# Patient Record
Sex: Male | Born: 1975
Health system: Southern US, Community
[De-identification: ages and names within clinical notes are randomized; demographics above are authoritative.]

## PROBLEM LIST (undated history)

## (undated) DIAGNOSIS — J45909 Unspecified asthma, uncomplicated: Secondary | ICD-10-CM

## (undated) DIAGNOSIS — G4730 Sleep apnea, unspecified: Secondary | ICD-10-CM

## (undated) HISTORY — PX: VASECTOMY: SHX75

---

## 2012-09-16 ENCOUNTER — Other Ambulatory Visit: Payer: Self-pay | Admitting: Urology

## 2012-09-17 ENCOUNTER — Encounter (HOSPITAL_BASED_OUTPATIENT_CLINIC_OR_DEPARTMENT_OTHER): Payer: Self-pay | Admitting: *Deleted

## 2012-09-17 NOTE — H&P (Signed)
History of Present Illness   Mr. Brandon Yang presents today for followup of his scrotal mass. Again, approximately 10 years ago he underwent vasectomy. He came to see me a little over a year ago with a 6-9 month history of a mass in his scrotum. At that time we found what appeared to be a smooth-wall mobile cystic-feeling mass in the inferior aspect of his scrotum. It was not clearly attached to the epididymis and we thought this could be a midline media raphe cyst. It did transilluminate at that time. He was to come in for an ultrasound and followup but canceled those appointments. In the interim, this has increased somewhat in size. It does not really hurt, but occasionally will be annoying to him. He has also noticed at times, difficulty maintaining erections. No other complaints        Past Medical History Problems  1. History of  Allergies V15.09  Surgical History Problems  1. History of  Surgery Of Male Genitalia Vasectomy V25.2  Current Meds 1. No Reported Medications  Allergies Medication  1. No Known Drug Allergies  Family History Problems  1. Paternal history of  Family Health Status - Father's Age 27yrs 2. Maternal history of  Family Health Status - Mother's Age 64yrs 3. Paternal history of  Hypertension V17.49 Denied  4. Family history of  Family Health Status Number Of Children  Social History Problems  1. Being A Social Drinker 2. Caffeine Use 0-1 qd 3. Marital History - Currently Married 4. Never A Smoker 5. 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 History of  Tobacco Use  Review of Systems Genitourinary, constitutional, skin, eye, otolaryngeal, hematologic/lymphatic, cardiovascular, pulmonary, endocrine, musculoskeletal, gastrointestinal, neurological and psychiatric system(s) were reviewed and pertinent findings if present are noted.  Genitourinary: nocturia, erectile dysfunction, scrotal swelling and scrotal mass.    Vitals Vital Signs [Data Includes: Last 1  Day]  16Jan2014 01:42PM  BMI Calculated: 22.66 BSA Calculated: 1.84 Height: 5 ft 9 in Weight: 153 lb  Blood Pressure: 134 / 82 Temperature: 97.3 F Heart Rate: 79  Physical Exam Well-developed well-nourished male in no acute distress. Respiratory: Normal effort Cardiac: Regular rate and rhythm Abdomen: Soft nontender no palpable masses Genitourinary: Examination of the penis demonstrates no discharge, no masses, no lesions and a normal meatus. The scrotum is without lesions. The right epididymis is palpably normal and non-tender. The left epididymis is palpably normal and non-tender. The right testis is palpably normal, non-tender and without masses. The left testis is normal, non-tender and without masses.    Results/Data Urine [Data Includes: Last 1 Day]   16Jan2014 COLOR ORANGE  APPEARANCE CLEAR  SPECIFIC GRAVITY >1.030  pH 6.0  GLUCOSE NEG mg/dL BILIRUBIN NEG  KETONE TRACE mg/dL BLOOD NEG  PROTEIN NEG mg/dL UROBILINOGEN 0.2 mg/dL NITRITE NEG  LEUKOCYTE ESTERASE NEG    Scrotal ultrasonography was performed today. I reviewed the images as well as came in for some real-time scanning. The full report is included on the worksheet. The testes themselves were completely symmetric and homogeneous in appearance. There was no evidence of any intratesticular pathology. There were some very tiny cysts in the epididymis on the head on the left side. Clearly distinct from the testicle was a inhomogeneous mass measuring approximately 3 cm x 4 cm. This appeared to be pretty much in the midline of the scrotum and did have some blood flow. This was clearly not cystic and appears to be solid.    Assessment Assessed  1. 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 SCROTAL  U/S  Done: 16Jan2014 12:00AM 3. Follow-up Schedule Surgery Office  Follow-up  Requested for: 16Jan2014  Discussion/Summary   Mr. Brandon Yang clearly has a solid abnormality in the midline of his scrotum. This is not clearly a simple cystic lesion. What is difficult to understand is that a little over a year ago he had performed transillumination and felt that clearly transilluminated. When I examined him, I also thought that this transilluminated prior to ordering his ultrasound. Again, he did not come in for those followup and then noticed that this did not appear to transilluminate and has increased in size. It is clearly solid and does need to be excised. My working diagnosis would be hopefully a benign soft tissue tumor such as a leiomyoma. Obviously, a low-grade leiomyosarcoma, etc could not be excluded at this point. Given the fact that this is clearly inferior to the testicle and not in any way attached to the testicle or to the spermatic cord/adnexal structures, I would approach this in a scrotal manner. I did ask Dr. Laverle Patter to examine this as well and he concurs with that assessment and treatment plan. I would anticipate outpatient surgery with fairly normal recovery within a few days. The patient has also had some mild erectile dysfunction and is interested in having some additional samples of Viagra. We are hopeful that once his surgery is finished, that his confidence will be better and his problem will vanish.   Signatures Electronically signed by : Barron Alvine, M.D.; Sep 16 2012  4:13PM

## 2012-09-17 NOTE — Progress Notes (Signed)
Pt instructed npo p mn 1/19 . To [facility] 1/20 @ 0715.  Needs hgb on arrival.

## 2012-09-20 ENCOUNTER — Encounter (HOSPITAL_BASED_OUTPATIENT_CLINIC_OR_DEPARTMENT_OTHER): Payer: Self-pay | Admitting: Anesthesiology

## 2012-09-20 ENCOUNTER — Ambulatory Visit (HOSPITAL_BASED_OUTPATIENT_CLINIC_OR_DEPARTMENT_OTHER)
Admission: RE | Admit: 2012-09-20 | Discharge: 2012-09-20 | Disposition: A | Discharge status: Home / Self Care | Payer: BC Managed Care – PPO | Source: Ambulatory Visit | Attending: Urology | Admitting: Urology

## 2012-09-20 ENCOUNTER — Ambulatory Visit (HOSPITAL_BASED_OUTPATIENT_CLINIC_OR_DEPARTMENT_OTHER): Payer: BC Managed Care – PPO | Admitting: Anesthesiology

## 2012-09-20 ENCOUNTER — Encounter (HOSPITAL_BASED_OUTPATIENT_CLINIC_OR_DEPARTMENT_OTHER): Payer: Self-pay | Admitting: *Deleted

## 2012-09-20 ENCOUNTER — Encounter (HOSPITAL_BASED_OUTPATIENT_CLINIC_OR_DEPARTMENT_OTHER)
Admission: RE | Disposition: A | Discharge status: Home / Self Care | Payer: Self-pay | Source: Ambulatory Visit | Attending: Urology

## 2012-09-20 DIAGNOSIS — N508 Other specified disorders of male genital organs: Secondary | ICD-10-CM | POA: Insufficient documentation

## 2012-09-20 DIAGNOSIS — N4340 Spermatocele of epididymis, unspecified: Secondary | ICD-10-CM | POA: Insufficient documentation

## 2012-09-20 DIAGNOSIS — D4959 Neoplasm of unspecified behavior of other genitourinary organ: Secondary | ICD-10-CM | POA: Insufficient documentation

## 2012-09-20 DIAGNOSIS — N529 Male erectile dysfunction, unspecified: Secondary | ICD-10-CM | POA: Insufficient documentation

## 2012-09-20 HISTORY — DX: Sleep apnea, unspecified: G47.30

## 2012-09-20 HISTORY — PX: SCROTAL EXPLORATION: SHX2386

## 2012-09-20 HISTORY — DX: Unspecified asthma, uncomplicated: J45.909

## 2012-09-20 LAB — POCT HEMOGLOBIN-HEMACUE: Hemoglobin: 15.3 g/dL (ref 13.0–17.0)

## 2012-09-20 SURGERY — EXPLORATION, SCROTUM
Anesthesia: General | Site: Groin | Wound class: Clean Contaminated

## 2012-09-20 MED ORDER — BUPIVACAINE HCL (PF) 0.25 % IJ SOLN
INTRAMUSCULAR | Status: DC | PRN
  Administered 2012-09-20: 7 mL

## 2012-09-20 MED ORDER — PROMETHAZINE HCL 25 MG/ML IJ SOLN
6.2500 mg | INTRAMUSCULAR | Status: DC | PRN
  Filled 2012-09-20: qty 1

## 2012-09-20 MED ORDER — FENTANYL CITRATE 0.05 MG/ML IJ SOLN
25.0000 ug | INTRAMUSCULAR | Status: DC | PRN
  Administered 2012-09-20: 25 ug via INTRAVENOUS
  Filled 2012-09-20: qty 1

## 2012-09-20 MED ORDER — CEFAZOLIN SODIUM-DEXTROSE 2-3 GM-% IV SOLR
2.0000 g | INTRAVENOUS | Status: DC
  Filled 2012-09-20: qty 50

## 2012-09-20 MED ORDER — LACTATED RINGERS IV SOLN
INTRAVENOUS | Status: DC
  Administered 2012-09-20 (×3): via INTRAVENOUS
  Filled 2012-09-20: qty 1000

## 2012-09-20 MED ORDER — EPHEDRINE SULFATE 50 MG/ML IJ SOLN
INTRAMUSCULAR | Status: DC | PRN
  Administered 2012-09-20: 15 mg via INTRAVENOUS

## 2012-09-20 MED ORDER — HYDROCODONE-ACETAMINOPHEN 5-325 MG PO TABS
1.0000 | ORAL_TABLET | Freq: Four times a day (QID) | ORAL | Status: DC | PRN
  Administered 2012-09-20: 1 via ORAL
  Filled 2012-09-20: qty 1

## 2012-09-20 MED ORDER — PROPOFOL 10 MG/ML IV BOLUS
INTRAVENOUS | Status: DC | PRN
  Administered 2012-09-20: 300 mg via INTRAVENOUS

## 2012-09-20 MED ORDER — CEFAZOLIN SODIUM-DEXTROSE 2-3 GM-% IV SOLR
INTRAVENOUS | Status: DC | PRN
  Administered 2012-09-20: 2 g via INTRAVENOUS

## 2012-09-20 MED ORDER — MIDAZOLAM HCL 5 MG/5ML IJ SOLN
INTRAMUSCULAR | Status: DC | PRN
  Administered 2012-09-20: 2 mg via INTRAVENOUS

## 2012-09-20 MED ORDER — ONDANSETRON HCL 4 MG/2ML IJ SOLN
INTRAMUSCULAR | Status: DC | PRN
  Administered 2012-09-20: 4 mg via INTRAVENOUS

## 2012-09-20 MED ORDER — DEXAMETHASONE SODIUM PHOSPHATE 4 MG/ML IJ SOLN
INTRAMUSCULAR | Status: DC | PRN
  Administered 2012-09-20: 10 mg via INTRAVENOUS

## 2012-09-20 MED ORDER — KETOROLAC TROMETHAMINE 30 MG/ML IJ SOLN
15.0000 mg | Freq: Once | INTRAMUSCULAR | Status: DC | PRN
  Filled 2012-09-20: qty 1

## 2012-09-20 MED ORDER — HYDROCODONE-ACETAMINOPHEN 5-325 MG PO TABS: 1.0000 | ORAL_TABLET | Freq: Four times a day (QID) | ORAL | Status: DC | PRN

## 2012-09-20 MED ORDER — LIDOCAINE HCL (CARDIAC) 20 MG/ML IV SOLN
INTRAVENOUS | Status: DC | PRN
  Administered 2012-09-20: 80 mg via INTRAVENOUS

## 2012-09-20 MED ORDER — FENTANYL CITRATE 0.05 MG/ML IJ SOLN
INTRAMUSCULAR | Status: DC | PRN
  Administered 2012-09-20 (×2): 25 ug via INTRAVENOUS
  Administered 2012-09-20: 50 ug via INTRAVENOUS

## 2012-09-20 SURGICAL SUPPLY — 52 items
APPLICATOR COTTON TIP 6IN STRL (MISCELLANEOUS) ×2 IMPLANT
BANDAGE GAUZE ELAST BULKY 4 IN (GAUZE/BANDAGES/DRESSINGS) ×2 IMPLANT
BLADE SURG 10 STRL SS (BLADE) IMPLANT
BLADE SURG 15 STRL LF DISP TIS (BLADE) ×1 IMPLANT
BLADE SURG 15 STRL SS (BLADE) ×1
BLADE SURG ROTATE 9660 (MISCELLANEOUS) ×2 IMPLANT
CANISTER SUCTION 1200CC (MISCELLANEOUS) IMPLANT
CANISTER SUCTION 2500CC (MISCELLANEOUS) IMPLANT
CLEANER CAUTERY TIP 5X5 PAD (MISCELLANEOUS) ×1 IMPLANT
CLOTH BEACON ORANGE TIMEOUT ST (SAFETY) ×2 IMPLANT
COVER MAYO STAND STRL (DRAPES) ×2 IMPLANT
COVER TABLE BACK 60X90 (DRAPES) ×2 IMPLANT
DISSECTOR ROUND CHERRY 3/8 STR (MISCELLANEOUS) IMPLANT
DRAIN PENROSE 18X1/4 LTX STRL (WOUND CARE) IMPLANT
DRAPE PED LAPAROTOMY (DRAPES) ×2 IMPLANT
DRSG TEGADERM 2-3/8X2-3/4 SM (GAUZE/BANDAGES/DRESSINGS) ×2 IMPLANT
ELECT NEEDLE TIP 2.8 STRL (NEEDLE) ×2 IMPLANT
ELECT REM PT RETURN 9FT ADLT (ELECTROSURGICAL) ×2
ELECTRODE REM PT RTRN 9FT ADLT (ELECTROSURGICAL) ×1 IMPLANT
ETHICON ×2 IMPLANT
GLOVE BIO SURGEON STRL SZ7.5 (GLOVE) ×2 IMPLANT
GLOVE BIO SURGEON STRL SZ8 (GLOVE) ×2 IMPLANT
GLOVE BIOGEL PI IND STRL 6.5 (GLOVE) ×1 IMPLANT
GLOVE BIOGEL PI IND STRL 7.0 (GLOVE) ×2 IMPLANT
GLOVE BIOGEL PI INDICATOR 6.5 (GLOVE) ×1
GLOVE BIOGEL PI INDICATOR 7.0 (GLOVE) ×2
GOWN PREVENTION PLUS XLARGE (GOWN DISPOSABLE) ×4 IMPLANT
GOWN STRL NON-REIN LRG LVL3 (GOWN DISPOSABLE) ×2 IMPLANT
GOWN W/COTTON TOWEL STD LRG (GOWNS) ×2 IMPLANT
GOWN XL W/COTTON TOWEL STD (GOWNS) ×2 IMPLANT
LOOP VESSEL MAXI BLUE (MISCELLANEOUS) IMPLANT
NEEDLE HYPO 22GX1.5 SAFETY (NEEDLE) ×2 IMPLANT
NS IRRIG 500ML POUR BTL (IV SOLUTION) IMPLANT
PACK BASIN DAY SURGERY FS (CUSTOM PROCEDURE TRAY) ×2 IMPLANT
PAD CLEANER CAUTERY TIP 5X5 (MISCELLANEOUS) ×1
PENCIL BUTTON HOLSTER BLD 10FT (ELECTRODE) ×2 IMPLANT
STRIP CLOSURE SKIN 1/2X4 (GAUZE/BANDAGES/DRESSINGS) IMPLANT
SUPPORT SCROTAL LG STRP (MISCELLANEOUS) ×2 IMPLANT
SUT SILK 0 SH 30 (SUTURE) IMPLANT
SUT SILK 0 TIES 10X30 (SUTURE) IMPLANT
SUT VIC AB 2-0 CT1 27 (SUTURE) ×1
SUT VIC AB 2-0 CT1 TAPERPNT 27 (SUTURE) ×1 IMPLANT
SUT VIC AB 3-0 CT1 36 (SUTURE) IMPLANT
SUT VIC AB 3-0 SH 27 (SUTURE) ×1
SUT VIC AB 3-0 SH 27X BRD (SUTURE) ×1 IMPLANT
SUT VICRYL 4-0 PS2 18IN ABS (SUTURE) ×2 IMPLANT
SYR BULB IRRIGATION 50ML (SYRINGE) IMPLANT
SYR CONTROL 10ML LL (SYRINGE) ×2 IMPLANT
TRAY DSU PREP LF (CUSTOM PROCEDURE TRAY) ×2 IMPLANT
TUBE CONNECTING 12X1/4 (SUCTIONS) ×2 IMPLANT
WATER STERILE IRR 500ML POUR (IV SOLUTION) IMPLANT
YANKAUER SUCT BULB TIP NO VENT (SUCTIONS) ×2 IMPLANT

## 2012-09-20 NOTE — Anesthesia Procedure Notes (Signed)
Procedure Name: LMA Insertion Date/Time: 09/20/2012 8:31 AM Performed by: Fran Lowes Pre-anesthesia Checklist: Patient identified, Emergency Drugs available, Suction available and Patient being monitored Patient Re-evaluated:Patient Re-evaluated prior to inductionOxygen Delivery Method: Circle System Utilized Preoxygenation: Pre-oxygenation with 100% oxygen Intubation Type: IV induction Ventilation: Mask ventilation without difficulty LMA: LMA inserted LMA Size: 4.0 Number of attempts: 1 Airway Equipment and Method: bite block Placement Confirmation: positive ETCO2 Tube secured with: Tape Dental Injury: Teeth and Oropharynx as per pre-operative assessment

## 2012-09-20 NOTE — Anesthesia Postprocedure Evaluation (Signed)
  Anesthesia Post-op Note  Patient: Brandon Yang  Procedure(s) Performed: Procedure(s) (LRB): SCROTUM EXPLORATION (N/A)  Patient Location: PACU  Anesthesia Type: General  Level of Consciousness: awake and alert   Airway and Oxygen Therapy: Patient Spontanous Breathing  Post-op Pain: mild  Post-op Assessment: Post-op Vital signs reviewed, Patient's Cardiovascular Status Stable, Respiratory Function Stable, Patent Airway and No signs of Nausea or vomiting  Last Vitals:  Filed Vitals:   09/20/12 0935  BP: 136/75  Pulse: 72  Temp: 36.3 C  Resp: 13    Post-op Vital Signs: stable   Complications: No apparent anesthesia complications

## 2012-09-20 NOTE — Anesthesia Preprocedure Evaluation (Signed)
Anesthesia Evaluation  Patient identified by MRN, date of birth, ID band Patient awake    Reviewed: Allergy & Precautions, H&P , NPO status , Patient's Chart, lab work & pertinent test results  Airway Mallampati: II TM Distance: >3 FB Neck ROM: Full    Dental No notable dental hx.    Pulmonary asthma , sleep apnea ,  Mild asthma breath sounds clear to auscultation  Pulmonary exam normal       Cardiovascular negative cardio ROS  Rhythm:Regular Rate:Normal     Neuro/Psych negative neurological ROS  negative psych ROS   GI/Hepatic negative GI ROS, Neg liver ROS,   Endo/Other  negative endocrine ROS  Renal/GU negative Renal ROS  negative genitourinary   Musculoskeletal negative musculoskeletal ROS (+)   Abdominal   Peds negative pediatric ROS (+)  Hematology negative hematology ROS (+)   Anesthesia Other Findings   Reproductive/Obstetrics negative OB ROS                           Anesthesia Physical Anesthesia Plan  ASA: II  Anesthesia Plan: General   Post-op Pain Management:    Induction: Intravenous  Airway Management Planned: LMA  Additional Equipment:   Intra-op Plan:   Post-operative Plan:   Informed Consent: I have reviewed the patients History and Physical, chart, labs and discussed the procedure including the risks, benefits and alternatives for the proposed anesthesia with the patient or authorized representative who has indicated his/her understanding and acceptance.   Dental advisory given  Plan Discussed with: CRNA and Surgeon  Anesthesia Plan Comments:         Anesthesia Quick Evaluation

## 2012-09-20 NOTE — Transfer of Care (Signed)
Immediate Anesthesia Transfer of Care Note  Patient: Brandon Yang  Procedure(s) Performed: Procedure(s) (LRB): SCROTUM EXPLORATION (N/A)  Patient Location: Patient transported to PACU with oxygen via face mask at 4 Liters / Min  Anesthesia Type: General  Level of Consciousness: awake and alert   Airway & Oxygen Therapy: Patient Spontanous Breathing and Patient connected to face mask oxygen  Post-op Assessment: Report given to PACU RN and Post -op Vital signs reviewed and stable  Post vital signs: Reviewed and stable  Dentition: Teeth and oropharynx remain in pre-op condition  Complications: No apparent anesthesia complications

## 2012-09-20 NOTE — Op Note (Signed)
Preoperative diagnosis:peritesticular/scrotal mass Postoperative diagnosis:same  Procedure:scrotal exploration with excision of right peritesticular mass with partial epididymectomy   Surgeon: Valetta Fuller M.D.  Anesthesia: Gen.  Indications:patient is 37 years of age. He presented to see me a little over a year ago with a scrotal mass. At that time he was felt to have what palpably was a cystic mass in the midline of the scrotum. We felt that this was likely to be an inclusion cyst and felt that there was some transillumination. We had suggested a scrotal ultrasound to confirm that along with a followup. he canceled several appointments and never came back for followup until recently.on repeat exam this mass appeared to be larger and did not transilluminate. It did feel quite smooth and was very mobile. Ultrasound confirmed a scrotal mass completely discrete from the testicle. It did show blood flow and was clearly mixed echogenicity. This was not a simple cyst. We recommend surgical exploration with excision. We felt the chance of malignancy was low but certainly not zero. Clinically it was difficult to determine whether this was associated with the epididymis but clearly again was not associated with the testicle.     Technique and findings:patient was brought to the operating room where he underwent induction of general anesthesia. He was in a standard supine position and prepped and draped in usual manner. He has compression boots were utilized. The small incision was made in the median nerve of the scrotum. This mass measured approximately 4-5 cm. Again the mass appeared to be quite mobile and was easily dissected free of the overlying scrotal wall. It was clear however that was attached to the inferior aspect of the epididymis. There was a significant degree of blood supply into this mass. There were no obvious attachments to the scrotal wall. The mass was completely freed other than its attachment  to the inferior epididymis. Care was taken to leave some surrounding adventitia overlying the mass. The testicle itself on that side was slightly atrophic but otherwise palpably normal and the head of the epididymis revealed no pathology. We carefully dissected this down to its attachment to the epididymis where it was transected with hemostats and Vicryl suture was utilized. The mass along with a small portion of the epididymis was submitted for permanent pathologic analysis. The spermatic cord was injected with Marcaine. The hemiscrotum was irrigated. The testis was returned to the right hemiscrotum making sure that there was no obvious twisting of the spermatic cord. Multiple layers of Vicryl was used for closure. There were no obvious complications or problems. Patient was brought to recovery room in stable condition.

## 2012-09-20 NOTE — Interval H&P Note (Signed)
History and Physical Interval Note:  09/20/2012 8:24 AM  Brandon Yang  has presented today for surgery, with the diagnosis of SCROTAL MASS  The various methods of treatment have been discussed with the patient and family. After consideration of risks, benefits and other options for treatment, the patient has consented to  Procedure(s) (LRB) with comments: SCROTUM EXPLORATION (N/A) - 1 HR ALSO EXCISION OF SCROTAL MASS  as a surgical intervention .  The patient's history has been reviewed, patient examined, no change in status, stable for surgery.  I have reviewed the patient's chart and labs.  Questions were answered to the patient's satisfaction.     Brandon Yang S

## 2012-09-21 ENCOUNTER — Encounter (HOSPITAL_BASED_OUTPATIENT_CLINIC_OR_DEPARTMENT_OTHER): Payer: Self-pay | Admitting: Urology

## 2015-05-23 ENCOUNTER — Encounter (HOSPITAL_BASED_OUTPATIENT_CLINIC_OR_DEPARTMENT_OTHER): Payer: Self-pay

## 2015-05-23 ENCOUNTER — Emergency Department (HOSPITAL_BASED_OUTPATIENT_CLINIC_OR_DEPARTMENT_OTHER)
Admission: EM | Admit: 2015-05-23 | Discharge: 2015-05-23 | Disposition: A | Discharge status: Home / Self Care | Payer: 59 | Attending: Emergency Medicine | Admitting: Emergency Medicine

## 2015-05-23 ENCOUNTER — Ambulatory Visit: Payer: Self-pay | Admitting: Physician Assistant

## 2015-05-23 ENCOUNTER — Telehealth: Payer: Self-pay | Admitting: General Practice

## 2015-05-23 ENCOUNTER — Telehealth: Payer: Self-pay | Admitting: Physician Assistant

## 2015-05-23 ENCOUNTER — Emergency Department (HOSPITAL_BASED_OUTPATIENT_CLINIC_OR_DEPARTMENT_OTHER): Payer: 59

## 2015-05-23 DIAGNOSIS — R319 Hematuria, unspecified: Secondary | ICD-10-CM | POA: Insufficient documentation

## 2015-05-23 DIAGNOSIS — Z8669 Personal history of other diseases of the nervous system and sense organs: Secondary | ICD-10-CM | POA: Diagnosis not present

## 2015-05-23 DIAGNOSIS — Z79899 Other long term (current) drug therapy: Secondary | ICD-10-CM | POA: Diagnosis not present

## 2015-05-23 DIAGNOSIS — J45909 Unspecified asthma, uncomplicated: Secondary | ICD-10-CM | POA: Insufficient documentation

## 2015-05-23 DIAGNOSIS — Z720 Tobacco use: Secondary | ICD-10-CM | POA: Insufficient documentation

## 2015-05-23 LAB — URINALYSIS, ROUTINE W REFLEX MICROSCOPIC
Bilirubin Urine: NEGATIVE
GLUCOSE, UA: NEGATIVE mg/dL
KETONES UR: NEGATIVE mg/dL
Leukocytes, UA: NEGATIVE
NITRITE: NEGATIVE
PH: 6 (ref 5.0–8.0)
PROTEIN: NEGATIVE mg/dL
SPECIFIC GRAVITY, URINE: 1.021 (ref 1.005–1.030)
UROBILINOGEN UA: 0.2 mg/dL (ref 0.0–1.0)

## 2015-05-23 LAB — URINE MICROSCOPIC-ADD ON

## 2015-05-23 NOTE — ED Notes (Signed)
Pt verbalizes understanding of d/c instructions and denies any further needs at this time. 

## 2015-05-23 NOTE — Telephone Encounter (Signed)
Patient Brandon Yang canceling his appointment due to him being seen the ED last night.

## 2015-05-23 NOTE — ED Notes (Signed)
Pt states he had an episode of hematuria that started about 30 minutes ago.  He denies any pain, had intercourse tonight but denies any trauma, had some mild lower back pain earlier today.

## 2015-05-23 NOTE — Discharge Instructions (Signed)

## 2015-05-23 NOTE — Telephone Encounter (Signed)
Noted  

## 2015-05-23 NOTE — ED Provider Notes (Signed)
CSN: [id number]     Arrival date & time 05/23/15  0160 History   First MD Initiated Contact with Patient 05/23/15 [phone]     Chief Complaint  Patient presents with  . Hematuria     (Consider location/radiation/quality/duration/timing/severity/associated sxs/prior Treatment) Patient is a 39 y.o. male presenting with hematuria. The history is provided by the patient.  Hematuria This is a new problem. The current episode started 1 to 2 hours ago. The problem occurs constantly. The problem has not changed since onset.Pertinent negatives include no chest pain, no abdominal pain, no headaches and no shortness of breath. Nothing aggravates the symptoms. Nothing relieves the symptoms. He has tried nothing for the symptoms. The treatment provided no relief.  Had sex 4 hours before symptoms started awoke to urinate now painless hematuria  Past Medical History  Diagnosis Date  . Asthma     related to seasonal/environmental allergies  . Sleep apnea     pt states he snores loudly and thinks he has "some obstruction" at times. no sleep study   Past Surgical History  Procedure Laterality Date  . Scrotal exploration  09/20/2012    Procedure: SCROTUM EXPLORATION;  Surgeon: Bernestine Amass, MD;  Location: [facility];  Service: Urology;  Laterality: N/A;  1 HR ALSO EXCISION OF SCROTAL MASS   . Vasectomy     History reviewed. No pertinent family history. Social History  Substance Use Topics  . Smoking status: Current Some Day Smoker    Types: Cigarettes  . Smokeless tobacco: None  . Alcohol Use: 1.2 oz/week    2 Cans of beer per week    Review of Systems  Constitutional: Negative for fever.  Respiratory: Negative for shortness of breath.   Cardiovascular: Negative for chest pain.  Gastrointestinal: Negative for nausea, vomiting and abdominal pain.  Genitourinary: Positive for hematuria. Negative for dysuria, frequency, flank pain, discharge, penile swelling, scrotal swelling,  difficulty urinating, genital sores, penile pain and testicular pain.  Neurological: Negative for headaches.  All other systems reviewed and are negative.     Allergies  Wheat bran  Home Medications   Prior to Admission medications   Medication Sig Start Date End Date Taking? Authorizing Provider  albuterol (PROVENTIL HFA;VENTOLIN HFA) 108 (90 BASE) MCG/ACT inhaler Inhale 2 puffs into the lungs every 6 (six) hours as needed. Hasn't used in over a year    Historical Provider, MD  beta carotene w/minerals (OCUVITE) tablet Take 1 tablet by mouth daily.    Historical Provider, MD  HYDROcodone-acetaminophen (NORCO/VICODIN) 5-325 MG per tablet Take 1-2 tablets by mouth every 6 (six) hours as needed for pain. 09/20/12   Rana Snare, MD  loratadine (CLARITIN) 10 MG tablet Take 10 mg by mouth as needed.    Historical Provider, MD   BP 162/117 mmHg  Pulse 72  Temp(Src) 98 F (36.7 C) (Oral)  Resp 20  Ht 5\' 8"  (1.727 m)  Wt 155 lb (70.308 kg)  BMI 23.57 kg/m2  SpO2 98% Physical Exam  Constitutional: He is oriented to person, place, and time. He appears well-developed and well-nourished. No distress.  HENT:  Head: Normocephalic and atraumatic.  Mouth/Throat: Oropharynx is clear and moist.  Eyes: Conjunctivae are normal. Pupils are equal, round, and reactive to light.  Neck: Normal range of motion. Neck supple.  Cardiovascular: Normal rate, regular rhythm and intact distal pulses.   Pulmonary/Chest: Effort normal and breath sounds normal. No respiratory distress. He has no wheezes. He has no rales.  Abdominal:  Soft. Bowel sounds are normal. There is no tenderness. There is no rebound and no guarding.  Musculoskeletal: Normal range of motion.  Neurological: He is alert and oriented to person, place, and time.  Skin: Skin is warm.  Psychiatric: He has a normal mood and affect.    ED Course  Procedures (including critical care time) Labs Review Labs Reviewed  URINALYSIS, ROUTINE W  REFLEX MICROSCOPIC (NOT AT [facility]) - Abnormal; Notable for the following:    Hgb urine dipstick MODERATE (*)    All other components within normal limits  URINE MICROSCOPIC-ADD ON    Imaging Review Ct Renal Stone Study  05/23/2015   CLINICAL DATA:  Hematuria starting 30 minutes ago.  EXAM: CT ABDOMEN AND PELVIS WITHOUT CONTRAST  TECHNIQUE: Multidetector CT imaging of the abdomen and pelvis was performed following the standard protocol without IV contrast.  COMPARISON:  None.  FINDINGS: Lower chest and abdominal wall:  No contributory findings.  Hepatobiliary: No focal liver abnormality.No evidence of biliary obstruction or stone.  Pancreas: Unremarkable.  Spleen: Unremarkable.  Adrenals/Urinary Tract: Negative adrenals. No hydronephrosis or stone. No gross mass lesion by noncontrast technique. Unremarkable bladder.  Reproductive:No pathologic findings.  Stomach/Bowel:  No obstruction. No appendicitis.  Vascular/Lymphatic: No acute vascular abnormality. No mass or adenopathy.  Peritoneal: No ascites or pneumoperitoneum.  Musculoskeletal: No acute abnormalities.  IMPRESSION: Normal study.  No explanation for hematuria.   Electronically Signed   By: Monte Fantasia M.D.   On: 05/23/2015 05:30   I have personally reviewed and evaluated these images and lab results as part of my medical decision-making.   EKG Interpretation None      MDM   Final diagnoses:  Hematuria    Follow up with Dr. Risa Grill for ongoing work up of painless hematuria.  Patient verbalizes understanding and agrees to follow up    April Palumbo, MD 05/23/15 [phone]

## 2015-05-30 NOTE — Telephone Encounter (Signed)
Pt was no show 05/23/15 2:30pm for new patient appt, pt has not rescheduled, charge for no show?

## 2015-05-30 NOTE — Telephone Encounter (Signed)
No charge. 

## 2015-12-27 ENCOUNTER — Encounter (HOSPITAL_COMMUNITY): Payer: Self-pay | Admitting: Emergency Medicine

## 2015-12-27 ENCOUNTER — Emergency Department (HOSPITAL_COMMUNITY)
Admission: EM | Admit: 2015-12-27 | Discharge: 2015-12-27 | Disposition: A | Discharge status: Home / Self Care | Payer: Self-pay | Attending: Emergency Medicine | Admitting: Emergency Medicine

## 2015-12-27 DIAGNOSIS — J45909 Unspecified asthma, uncomplicated: Secondary | ICD-10-CM | POA: Insufficient documentation

## 2015-12-27 DIAGNOSIS — G4730 Sleep apnea, unspecified: Secondary | ICD-10-CM | POA: Insufficient documentation

## 2015-12-27 DIAGNOSIS — Z79891 Long term (current) use of opiate analgesic: Secondary | ICD-10-CM | POA: Insufficient documentation

## 2015-12-27 DIAGNOSIS — N390 Urinary tract infection, site not specified: Secondary | ICD-10-CM | POA: Insufficient documentation

## 2015-12-27 DIAGNOSIS — F17210 Nicotine dependence, cigarettes, uncomplicated: Secondary | ICD-10-CM | POA: Insufficient documentation

## 2015-12-27 DIAGNOSIS — Z79899 Other long term (current) drug therapy: Secondary | ICD-10-CM | POA: Insufficient documentation

## 2015-12-27 DIAGNOSIS — Z7951 Long term (current) use of inhaled steroids: Secondary | ICD-10-CM | POA: Insufficient documentation

## 2015-12-27 LAB — URINALYSIS, ROUTINE W REFLEX MICROSCOPIC
BILIRUBIN URINE: NEGATIVE
Glucose, UA: NEGATIVE mg/dL
HGB URINE DIPSTICK: NEGATIVE
Ketones, ur: NEGATIVE mg/dL
NITRITE: NEGATIVE
PROTEIN: NEGATIVE mg/dL
Specific Gravity, Urine: 1.012 (ref 1.005–1.030)
pH: 6.5 (ref 5.0–8.0)

## 2015-12-27 LAB — URINE MICROSCOPIC-ADD ON
Bacteria, UA: NONE SEEN
RBC / HPF: NONE SEEN RBC/hpf (ref 0–5)
SQUAMOUS EPITHELIAL / LPF: NONE SEEN

## 2015-12-27 MED ORDER — CEPHALEXIN 250 MG PO CAPS: 500.0000 mg | ORAL_CAPSULE | Freq: Two times a day (BID) | ORAL | Status: DC

## 2015-12-27 MED ORDER — CEFTRIAXONE SODIUM 250 MG IJ SOLR
250.0000 mg | Freq: Once | INTRAMUSCULAR | Status: AC
  Administered 2015-12-27: 250 mg via INTRAMUSCULAR
  Filled 2015-12-27: qty 250

## 2015-12-27 MED ORDER — AZITHROMYCIN 250 MG PO TABS
1000.0000 mg | ORAL_TABLET | Freq: Once | ORAL | Status: AC
  Administered 2015-12-27: 1000 mg via ORAL
  Filled 2015-12-27: qty 4

## 2015-12-27 MED ORDER — STERILE WATER FOR INJECTION IJ SOLN
INTRAMUSCULAR | Status: AC
  Filled 2015-12-27: qty 10

## 2015-12-27 NOTE — ED Notes (Signed)
Noticed "pink-ish" colored urine starting a couple hours ago. Pt states this happened last year and says they were concerned for a bacterial infection. Denies any other urinary symptoms. States he does feel "slight throbbing" in his right testicle.

## 2015-12-27 NOTE — Discharge Instructions (Signed)
Sexually Transmitted Disease °A sexually transmitted disease (STD) is a disease or infection that may be passed (transmitted) from person to person, usually during sexual activity. This may happen by way of saliva, semen, blood, vaginal mucus, or urine. Common STDs include: °· Gonorrhea. °· Chlamydia. °· Syphilis. °· HIV and AIDS. °· Genital herpes. °· Hepatitis B and C. °· Trichomonas. °· Human papillomavirus (HPV). °· Pubic lice. °· Scabies. °· Mites. °· Bacterial vaginosis. °WHAT ARE CAUSES OF STDs? °An STD may be caused by bacteria, a virus, or parasites. STDs are often transmitted during sexual activity if one person is infected. However, they may also be transmitted through nonsexual means. STDs may be transmitted after:  °· Sexual intercourse with an infected person. °· Sharing sex toys with an infected person. °· Sharing needles with an infected person or using unclean piercing or tattoo needles. °· Having intimate contact with the genitals, mouth, or rectal areas of an infected person. °· Exposure to infected fluids during birth. °WHAT ARE THE SIGNS AND SYMPTOMS OF STDs? °Different STDs have different symptoms. Some people may not have any symptoms. If symptoms are present, they may include: °· Painful or bloody urination. °· Pain in the pelvis, abdomen, vagina, anus, throat, or eyes. °· A skin rash, itching, or irritation. °· Growths, ulcerations, blisters, or sores in the genital and anal areas. °· Abnormal vaginal discharge with or without bad odor. °· Penile discharge in men. °· Fever. °· Pain or bleeding during sexual intercourse. °· Swollen glands in the groin area. °· Yellow skin and eyes (jaundice). This is seen with hepatitis. °· Swollen testicles. °· Infertility. °· Sores and blisters in the mouth. °HOW ARE STDs DIAGNOSED? °To make a diagnosis, your health care provider may: °· Take a medical history. °· Perform a physical exam. °· Take a sample of any discharge to examine. °· Swab the throat,  cervix, opening to the penis, rectum, or vagina for testing. °· Test a sample of your first morning urine. °· Perform blood tests. °· Perform a Pap test, if this applies. °· Perform a colposcopy. °· Perform a laparoscopy. °HOW ARE STDs TREATED? °Treatment depends on the STD. Some STDs may be treated but not cured. °· Chlamydia, gonorrhea, trichomonas, and syphilis can be cured with antibiotic medicine. °· Genital herpes, hepatitis, and HIV can be treated, but not cured, with prescribed medicines. The medicines lessen symptoms. °· Genital warts from HPV can be treated with medicine or by freezing, burning (electrocautery), or surgery. Warts may come back. °· HPV cannot be cured with medicine or surgery. However, abnormal areas may be removed from the cervix, vagina, or vulva. °· If your diagnosis is confirmed, your recent sexual partners need treatment. This is true even if they are symptom-free or have a negative culture or evaluation. They should not have sex until their health care providers say it is okay. °· Your health care provider may test you for infection again 3 months after treatment. °HOW CAN I REDUCE MY RISK OF GETTING AN STD? °Take these steps to reduce your risk of getting an STD: °· Use latex condoms, dental dams, and water-soluble lubricants during sexual activity. Do not use petroleum jelly or oils. °· Avoid having multiple sex partners. °· Do not have sex with someone who has other sex partners °· Do not have sex with anyone you do not know or who is at high risk for an STD. °· Avoid risky sex practices that can break your skin. °· Do not have sex   if you have open sores on your mouth or skin.  Avoid drinking too much alcohol or taking illegal drugs. Alcohol and drugs can affect your judgment and put you in a vulnerable position.  Avoid engaging in oral and anal sex acts.  Get vaccinated for HPV and hepatitis. If you have not received these vaccines in the past, talk to your health care  provider about whether one or both might be right for you.  If you are at risk of being infected with HIV, it is recommended that you take a prescription medicine daily to prevent HIV infection. This is called pre-exposure prophylaxis (PrEP). You are considered at risk if:  You are a man who has sex with other men (MSM).  You are a heterosexual man or woman and are sexually active with more than one partner.  You take drugs by injection.  You are sexually active with a partner who has HIV.  Talk with your health care provider about whether you are at high risk of being infected with HIV. If you choose to begin PrEP, you should first be tested for HIV. You should then be tested every 3 months for as long as you are taking PrEP. WHAT SHOULD I DO IF I THINK I HAVE AN STD?  See your health care provider.  Tell your sexual partner(s). They should be tested and treated for any STDs.  Do not have sex until your health care provider says it is okay. WHEN SHOULD I GET IMMEDIATE MEDICAL CARE? Contact your health care provider right away if:   You have severe abdominal pain.  You are a man and notice swelling or pain in your testicles.  You are a woman and notice swelling or pain in your vagina.   This information is not intended to replace advice given to you by your health care provider. Make sure you discuss any questions you have with your health care provider.   Document Released: 11/08/2002 Document Revised: 09/08/2014 Document Reviewed: 03/08/2013 Elsevier Interactive Patient Education 2016 Elsevier Inc. Urinary Tract Infection Urinary tract infections (UTIs) can develop anywhere along your urinary tract. Your urinary tract is your body's drainage system for removing wastes and extra water. Your urinary tract includes two kidneys, two ureters, a bladder, and a urethra. Your kidneys are a pair of bean-shaped organs. Each kidney is about the size of your fist. They are located below your  ribs, one on each side of your spine. CAUSES Infections are caused by microbes, which are microscopic organisms, including fungi, viruses, and bacteria. These organisms are so small that they can only be seen through a microscope. Bacteria are the microbes that most commonly cause UTIs. SYMPTOMS  Symptoms of UTIs may vary by age and gender of the patient and by the location of the infection. Symptoms in young women typically include a frequent and intense urge to urinate and a painful, burning feeling in the bladder or urethra during urination. Older women and men are more likely to be tired, shaky, and weak and have muscle aches and abdominal pain. A fever may mean the infection is in your kidneys. Other symptoms of a kidney infection include pain in your back or sides below the ribs, nausea, and vomiting. DIAGNOSIS To diagnose a UTI, your caregiver will ask you about your symptoms. Your caregiver will also ask you to provide a urine sample. The urine sample will be tested for bacteria and white blood cells. White blood cells are made by your body to  help fight infection. TREATMENT  Typically, UTIs can be treated with medication. Because most UTIs are caused by a bacterial infection, they usually can be treated with the use of antibiotics. The choice of antibiotic and length of treatment depend on your symptoms and the type of bacteria causing your infection. HOME CARE INSTRUCTIONS  If you were prescribed antibiotics, take them exactly as your caregiver instructs you. Finish the medication even if you feel better after you have only taken some of the medication.  Drink enough water and fluids to keep your urine clear or pale yellow.  Avoid caffeine, tea, and carbonated beverages. They tend to irritate your bladder.  Empty your bladder often. Avoid holding urine for long periods of time.  Empty your bladder before and after sexual intercourse.  After a bowel movement, women should cleanse from  front to back. Use each tissue only once. SEEK MEDICAL CARE IF:   You have back pain.  You develop a fever.  Your symptoms do not begin to resolve within 3 days. SEEK IMMEDIATE MEDICAL CARE IF:   You have severe back pain or lower abdominal pain.  You develop chills.  You have nausea or vomiting.  You have continued burning or discomfort with urination. MAKE SURE YOU:   Understand these instructions.  Will watch your condition.  Will get help right away if you are not doing well or get worse.   This information is not intended to replace advice given to you by your health care provider. Make sure you discuss any questions you have with your health care provider.   Document Released: 05/28/2005 Document Revised: 05/09/2015 Document Reviewed: 09/26/2011 Elsevier Interactive Patient Education [employer].

## 2015-12-27 NOTE — ED Provider Notes (Signed)
CSN: [id number]     Arrival date & time 12/27/15  1849 History   First MD Initiated Contact with Patient 12/27/15 2037     Chief Complaint  Patient presents with  . Hematuria     (Consider location/radiation/quality/duration/timing/severity/associated sxs/prior Treatment) HPI Comments: Patient presents to the emergency department with chief complaint of hematuria. States that he notices symptoms today. He states that he has had some slight discomfort in his right testicle, but denies pain. He rates this as a 0.5 out of 10 in severity. He denies any associated fevers chills. Denies any nausea or vomiting. He does report new sexual contact about 14 days ago. Denies any other associated symptoms. He has not taken anything for symptoms. There are no modifying factors.  The history is provided by the patient. No language interpreter was used.    Past Medical History  Diagnosis Date  . Asthma     related to seasonal/environmental allergies  . Sleep apnea     pt states he snores loudly and thinks he has "some obstruction" at times. no sleep study   Past Surgical History  Procedure Laterality Date  . Scrotal exploration  09/20/2012    Procedure: SCROTUM EXPLORATION;  Surgeon: Bernestine Amass, MD;  Location: [facility];  Service: Urology;  Laterality: N/A;  1 HR ALSO EXCISION OF SCROTAL MASS   . Vasectomy     History reviewed. No pertinent family history. Social History  Substance Use Topics  . Smoking status: Current Some Day Smoker    Types: Cigarettes  . Smokeless tobacco: None  . Alcohol Use: 1.2 oz/week    2 Cans of beer per week    Review of Systems  Genitourinary: Positive for hematuria.  All other systems reviewed and are negative.     Allergies  Wheat bran  Home Medications   Prior to Admission medications   Medication Sig Start Date End Date Taking? Authorizing Provider  albuterol (PROVENTIL HFA;VENTOLIN HFA) 108 (90 BASE) MCG/ACT inhaler Inhale 2  puffs into the lungs every 6 (six) hours as needed for wheezing or shortness of breath. Hasn't used in over a year   Yes Historical Provider, MD  loratadine (CLARITIN) 10 MG tablet Take 10 mg by mouth daily.    Yes Historical Provider, MD  HYDROcodone-acetaminophen (NORCO/VICODIN) 5-325 MG per tablet Take 1-2 tablets by mouth every 6 (six) hours as needed for pain. Patient not taking: Reported on 12/27/2015 09/20/12   Rana Snare, MD   BP 180/124 mmHg  Pulse 74  Temp(Src) 98.2 F (36.8 C) (Oral)  Resp 18  SpO2 97% Physical Exam  Constitutional: He is oriented to person, place, and time. He appears well-developed and well-nourished.  HENT:  Head: Normocephalic and atraumatic.  Eyes: Conjunctivae and EOM are normal.  Neck: Normal range of motion.  Cardiovascular: Normal rate.   Pulmonary/Chest: Effort normal.  Abdominal: He exhibits no distension.  Genitourinary: Penis normal.  Normal circumcised male, no lesions, masses, or other abnormality about the penis, scrotum, testes, no tenderness to palpation, no swelling  Musculoskeletal: Normal range of motion.  Neurological: He is alert and oriented to person, place, and time.  Skin: Skin is dry.  Psychiatric: He has a normal mood and affect. His behavior is normal. Judgment and thought content normal.  Nursing note and vitals reviewed.   ED Course  Procedures (including critical care time) Labs Review Labs Reviewed  URINALYSIS, ROUTINE W REFLEX MICROSCOPIC (NOT AT [facility]) - Abnormal; Notable for the following:  APPearance CLOUDY (*)    Leukocytes, UA TRACE (*)    All other components within normal limits  URINE MICROSCOPIC-ADD ON  GC/CHLAMYDIA PROBE AMP () NOT AT [facility]    Imaging Review No results found. I have personally reviewed and evaluated these images and lab results as part of my medical decision-making.    MDM   Final diagnoses:  UTI (lower urinary tract infection)    Patient with hematuria. Urinalysis  shows 6-30 white cells. Patient did have new sexual contact. Will treat with Rocephin and azithromycin in the ED. Will also discharge with some Keflex. Urine culture pending. Gonorrhea Chlamydia cultures pending. Patient did not have any focal pain on exam. He is well-appearing, not in apparent distress. Will discharge to home.    Montine Circle, PA-C 12/27/15 2138

## 2015-12-27 NOTE — ED Notes (Signed)
Pt denies abd and lower back pain. Pt denies trauma to abd or lower back. Pt reports discomfort over pain of right testicular area. 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 Pt denies dysuria.

## 2015-12-28 LAB — GC/CHLAMYDIA PROBE AMP (~~LOC~~) NOT AT ARMC
Chlamydia: NEGATIVE
Neisseria Gonorrhea: NEGATIVE

## 2015-12-29 LAB — URINE CULTURE: Culture: NO GROWTH

## 2016-01-10 LAB — BASIC METABOLIC PANEL
BUN: 9 mg/dL (ref 4–21)
Creatinine: 1 mg/dL (ref 0.6–1.3)
Glucose: 116 mg/dL
Potassium: 3.5 mmol/L (ref 3.4–5.3)
Sodium: 144 mmol/L (ref 137–147)

## 2016-01-10 LAB — CBC AND DIFFERENTIAL
HEMATOCRIT: 46 % (ref 41–53)
HEMOGLOBIN: 15.7 g/dL (ref 13.5–17.5)
Platelets: 197 10*3/uL (ref 150–399)
WBC: 8.1 10*3/mL

## 2016-01-10 LAB — HEPATIC FUNCTION PANEL
ALK PHOS: 74 U/L (ref 25–125)
ALT: 28 U/L (ref 10–40)
AST: 23 U/L (ref 14–40)
BILIRUBIN, TOTAL: 1.3 mg/dL

## 2016-03-12 NOTE — ED Provider Notes (Signed)
Medical screening examination/treatment/procedure(s) were performed by non-physician practitioner and as supervising physician I was immediately available for consultation/collaboration.   EKG Interpretation None       Isla Pence, MD 03/12/16 1401

## 2016-03-19 ENCOUNTER — Telehealth: Payer: Self-pay | Admitting: *Deleted

## 2016-03-19 NOTE — Telephone Encounter (Addendum)
Unable to reach patient at time of Pre-Visit Call. Voicemail full, unable to leave message.

## 2016-03-20 ENCOUNTER — Ambulatory Visit (INDEPENDENT_AMBULATORY_CARE_PROVIDER_SITE_OTHER): Payer: PRIVATE HEALTH INSURANCE | Admitting: Family Medicine

## 2016-03-20 ENCOUNTER — Encounter: Payer: Self-pay | Admitting: Emergency Medicine

## 2016-03-20 VITALS — BP 151/107 | HR 86 | Temp 98.9°F | Ht 69.75 in | Wt 162.0 lb

## 2016-03-20 DIAGNOSIS — I10 Essential (primary) hypertension: Secondary | ICD-10-CM | POA: Diagnosis not present

## 2016-03-20 MED ORDER — LISINOPRIL-HYDROCHLOROTHIAZIDE 10-12.5 MG PO TABS: 1.0000 | ORAL_TABLET | Freq: Every day | ORAL | Status: AC

## 2016-03-20 NOTE — Patient Instructions (Addendum)
It was very nice to meet you today- we will work on getting your blood pressure under better control Take the BP pill once a day- we may need to increase the dose eventually if your pressure does not respond well We will send a records request to the urgent care clinic you visited to get your labs  Work on decreasing stress in your life as much as you are able. Try to get more sleep, decrease salt and fast foods, and exercise a bit more intensely a couple of times a week.    If you are able to check your blood pressure in the community, please do so every few days and send me a list of readings in a couple of weeks.   Take care!

## 2016-03-20 NOTE — Progress Notes (Signed)
[facility] [address] [place], [place] [address] [phone]  Date:  03/20/2016   Name:  Brandon Yang   DOB:  November 25, 1975   MRN:  [id number]  PCP:  Lamar Blinks, MD    Chief Complaint: Establish Care   History of Present Illness:  Brandon Yang is a 40 y.o. very pleasant male patient who presents with the following:  Here today as a new patient.  He had his BP checked at [facility] and it was found to be high- he was treated with 2 medications but he ran out.  He ran out of it 2 weeks ago- he thinks he was on procardia and also hctz 25 This was the first time he had been treated for this He will occasionally feel like his right foot is a bit tingly- this may last 30 sec or so No headaches He had had some vision change- he has a chronic degenerative eye condition.  He does see an eye doctor No chest pain or SOB He does walk 3.5- 4 miles a day with his job and goes up and down stairs a lot; no CP with exertion He also does play baseketball  He does have a family history of HTN on both sides  He was never dx with HTN in the past   He does snore when he is really tired; he is currently single, but does not think that snoring is a chronic issue for him. He does have some nasal congestion when it is allergy season.   He is a Conservation officer, historic buildings- criminal defense.  He is under a lot of stress.  He works pretty long hours  [facility]- they checked his kidneys, etc. He reports that all was ok   There are no active problems to display for this patient.   Past Medical History  Diagnosis Date  . Asthma     related to seasonal/environmental allergies  . Sleep apnea     pt states he snores loudly and thinks he has "some obstruction" at times. no sleep study    Past Surgical History  Procedure Laterality Date  . Scrotal exploration  09/20/2012    Procedure: SCROTUM EXPLORATION;  Surgeon: Bernestine Amass,  MD;  Location: [facility];  Service: Urology;  Laterality: N/A;  1 HR ALSO EXCISION OF SCROTAL MASS   . Vasectomy      Social History  Substance Use Topics  . Smoking status: Former Smoker    Types: Cigarettes  . Smokeless tobacco: Not on file  . Alcohol Use: 1.2 oz/week    2 Cans of beer per week    No family history on file.  Allergies  Allergen Reactions  . Wheat Bran Other (See Comments)    Wheezing     Medication list has been reviewed and updated.  Current Outpatient Prescriptions on File Prior to Visit  Medication Sig Dispense Refill  . albuterol (PROVENTIL HFA;VENTOLIN HFA) 108 (90 BASE) MCG/ACT inhaler Inhale 2 puffs into the lungs every 6 (six) hours as needed for wheezing or shortness of breath. Hasn't used in over a year    . loratadine (CLARITIN) 10 MG tablet Take 10 mg by mouth daily.      No current facility-administered medications on file prior to visit.    Review of Systems:  As per HPI- otherwise negative.   Physical Examination: Filed Vitals:  03/20/16 1436 03/20/16 1450  BP: 152/100 151/107  Pulse: 86   Temp: 98.9 F (37.2 C)    Filed Vitals:   03/20/16 1436  Height: 5' 9.75" (1.772 m)  Weight: 162 lb (73.483 kg)   Body mass index is 23.4 kg/(m^2). Ideal Body Weight: Weight in (lb) to have BMI = 25: 172.6  GEN: WDWN, NAD, Non-toxic, A & O x 3, normal weight, looks well HEENT: Atraumatic, Normocephalic. Neck supple. No masses, No LAD. Ears and Nose: No external deformity. CV: RRR, No M/G/R. No JVD. No thrill. No extra heart sounds. PULM: CTA B, no wheezes, crackles, rhonchi. No retractions. No resp. distress. No accessory muscle use. ABD: S, NT, ND  EXTR: No c/c/e NEURO Normal gait.  PSYCH: Normally interactive. Conversant. Not depressed or anxious appearing.  Calm demeanor.   EKG:  SR, non specific T abnl but no acute or concerning findings   Assessment and Plan: Essential hypertension - Plan: EKG 12-Lead,  lisinopril-hydrochlorothiazide (PRINZIDE,ZESTORETIC) 10-12.5 MG tablet  Here today to discuss HTN He was dx a couple of months ago at [facility] and started on 2 medications- we think procardia and HCTZ 25; however he ran out of both a couple of weeks ago Will start on a low dose of lisinopril/ hctz for K balance and convenience EKG is reassuring Discussed lifestyle measures that may also help Requested records / labs from [facility] today,faxed form He will monitor his home BP and keep me posted.  Plan recheck pending some upcoming BP readings and labs   Signed Lamar Blinks, MD

## 2016-03-20 NOTE — Progress Notes (Signed)
Pre visit review using our clinic review tool, if applicable. No additional management support is needed unless otherwise documented below in the visit note. 

## 2016-04-02 ENCOUNTER — Other Ambulatory Visit: Payer: Self-pay | Admitting: Family Medicine

## 2016-04-15 ENCOUNTER — Other Ambulatory Visit: Payer: Self-pay | Admitting: Family Medicine

## 2016-04-15 NOTE — Progress Notes (Signed)
Pre visit review using our clinic review tool, if applicable. No additional management support is needed unless otherwise documented below in the visit note. 

## 2016-04-16 ENCOUNTER — Encounter: Payer: Self-pay | Admitting: Family Medicine

## 2017-01-15 IMAGING — CT CT RENAL STONE PROTOCOL
1 of 2 series · 15 of 32 positions shown, 20 images · non-contrast
Comparison: None.

CLINICAL DATA: Hematuria starting 30 minutes ago.

EXAM:
CT ABDOMEN AND PELVIS WITHOUT CONTRAST
TECHNIQUE: Multidetector CT imaging of the abdomen and pelvis was performed
following the standard protocol without IV contrast.

[Series 2: renal stone < 200 lbs 5.0 b31f · axial · 0.66mm/px · z∈[-484,-74]mm · 15 of 90 slices shown, 20 images]
[im 4/90  soft-tissue]
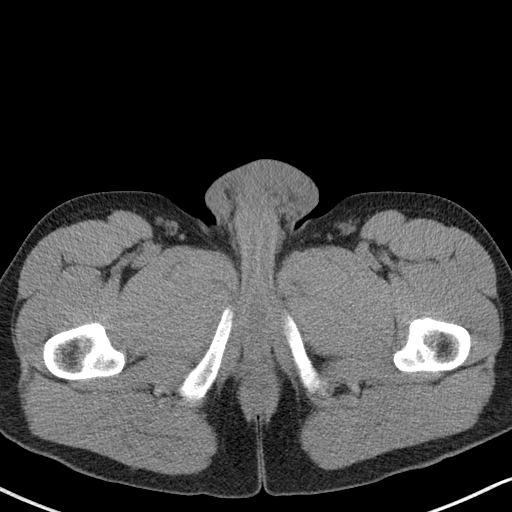
[im 4/90  bone]
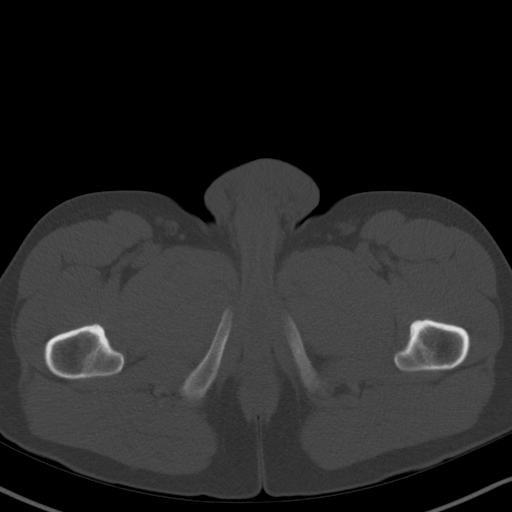
[im 12/90  soft-tissue]
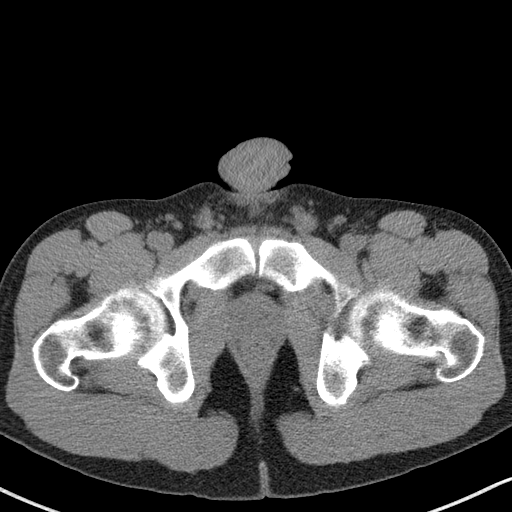
[im 16/90  soft-tissue]
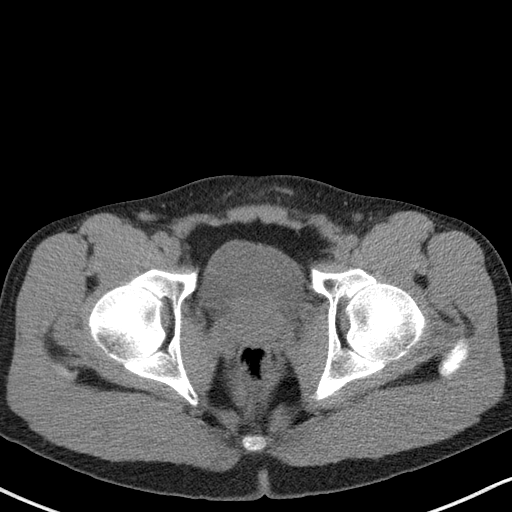
[im 24/90  soft-tissue]
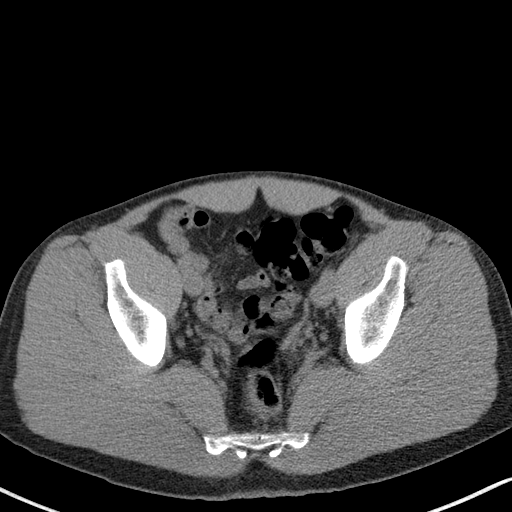
[im 31/90  soft-tissue]
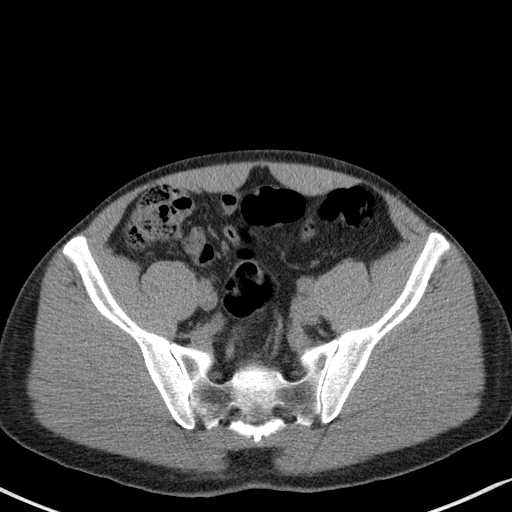
[im 35/90  soft-tissue]
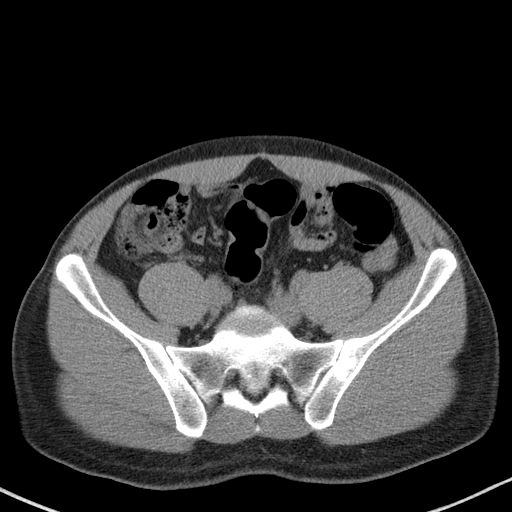
[im 43/90  soft-tissue]
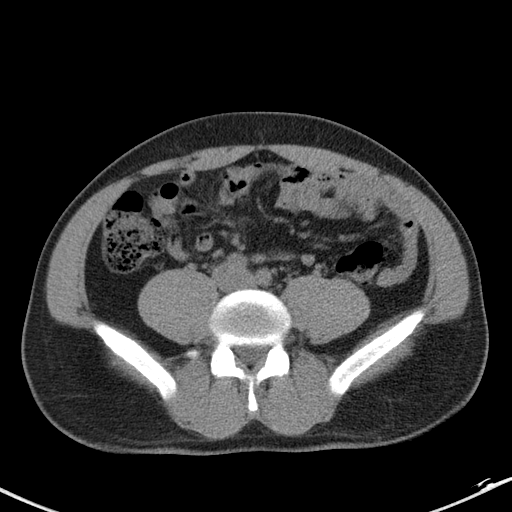
[im 47/90  soft-tissue]
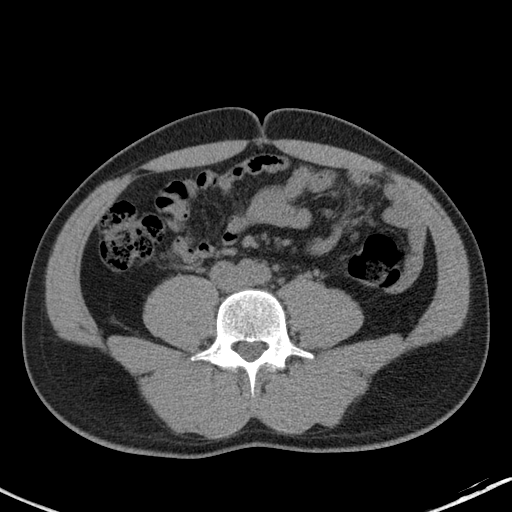
[im 55/90  soft-tissue]
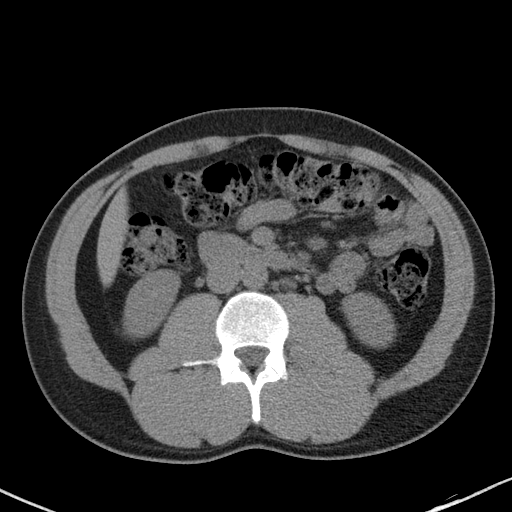
[im 55/90  bone]
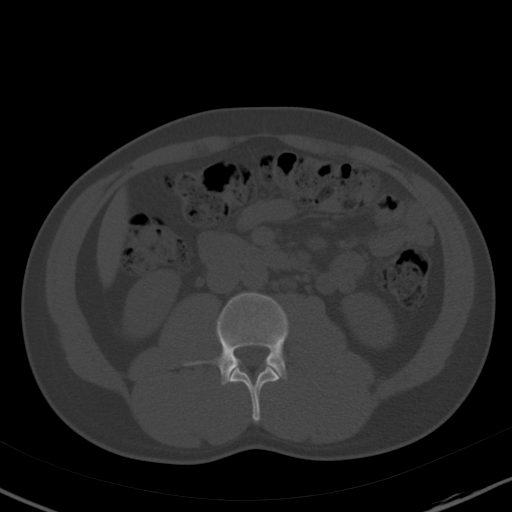
[im 59/90  soft-tissue]
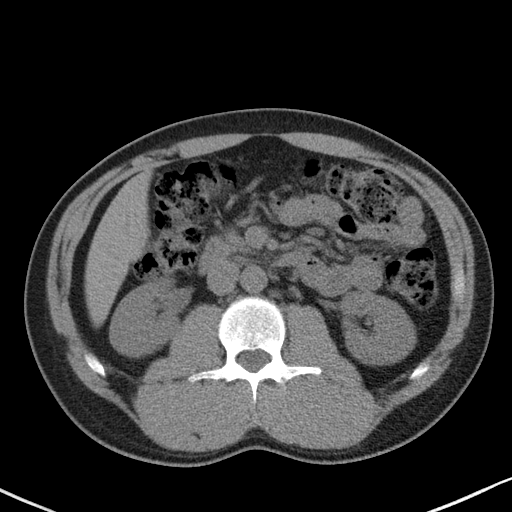
[im 66/90  soft-tissue]
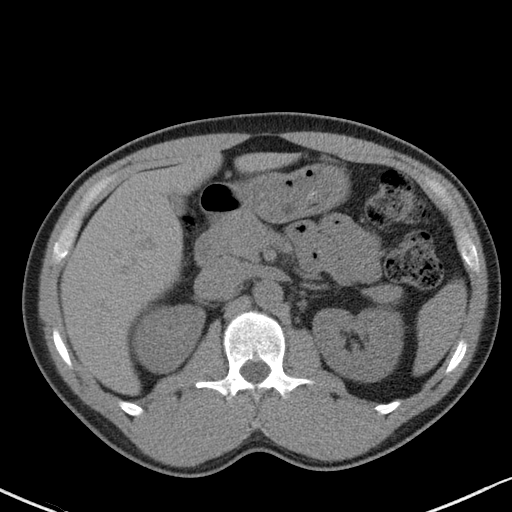
[im 74/90  soft-tissue]
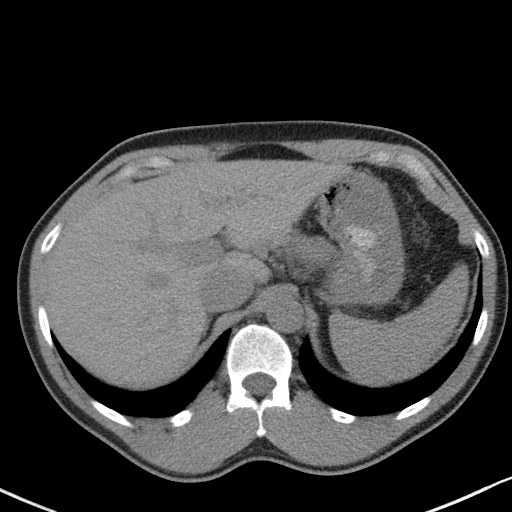
[im 74/90  lung]
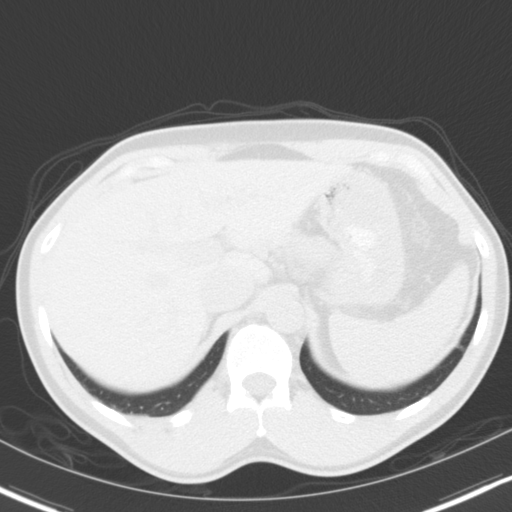
[im 78/90  soft-tissue]
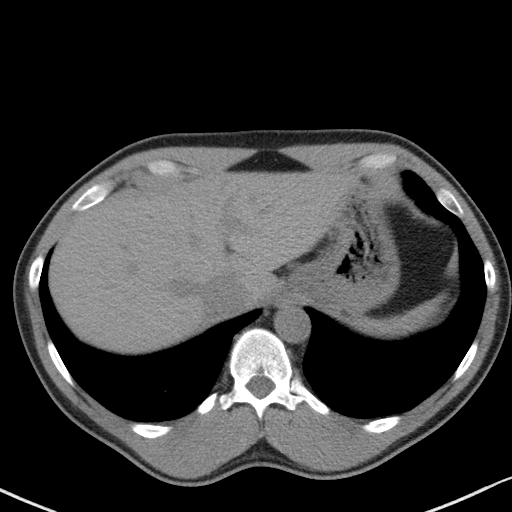
[im 78/90  lung]
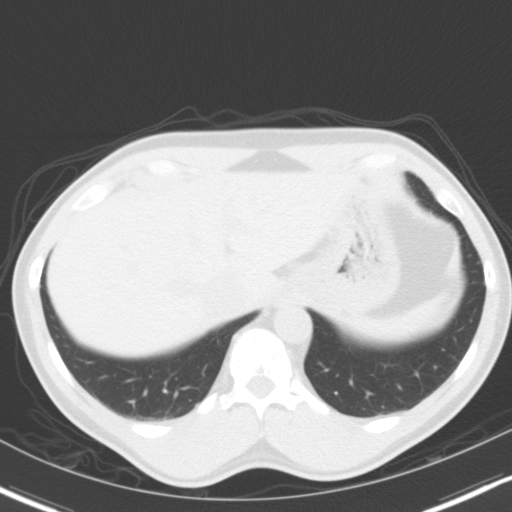
[im 82/90  lung]
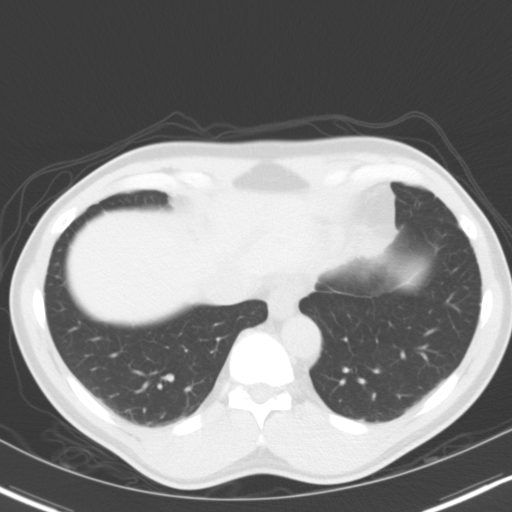
[im 86/90  soft-tissue]
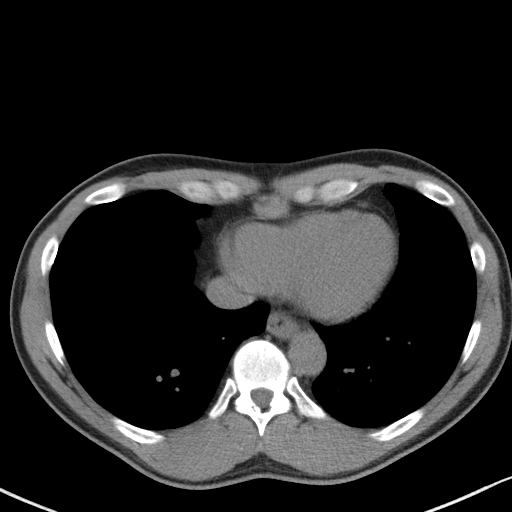
[im 86/90  lung]
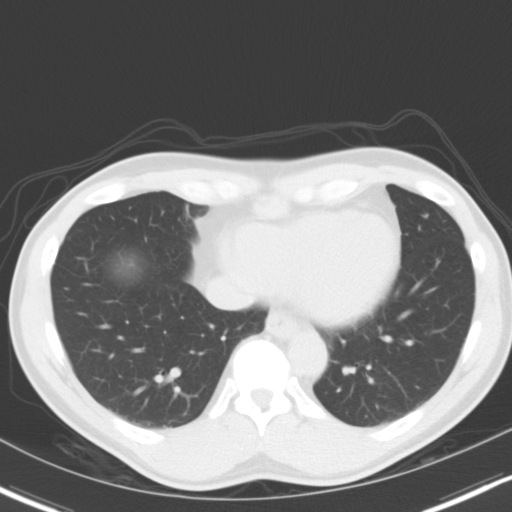

[15 of 32 positions shown; findings below may reference images not displayed]

FINDINGS: Lower chest and abdominal wall:  No contributory findings.

Hepatobiliary: No focal liver abnormality.No evidence of biliary
obstruction or stone.

Pancreas: Unremarkable.

Spleen: Unremarkable.

Adrenals/Urinary Tract: Negative adrenals. No hydronephrosis or
stone. No gross mass lesion by noncontrast technique. Unremarkable
bladder.

Reproductive:No pathologic findings.

Stomach/Bowel:  No obstruction. No appendicitis.

Vascular/Lymphatic: No acute vascular abnormality. No mass or
adenopathy.

Peritoneal: No ascites or pneumoperitoneum.

Musculoskeletal: No acute abnormalities.
IMPRESSION: Normal study.  No explanation for hematuria.

## 2020-06-08 ENCOUNTER — Other Ambulatory Visit: Payer: Self-pay
